# Patient Record
Sex: Female | Born: 1950 | Race: White | Hispanic: No | Smoking: Former smoker
Health system: Southern US, Community
[De-identification: ages and names within clinical notes are randomized; demographics above are authoritative.]

## PROBLEM LIST (undated history)

## (undated) DIAGNOSIS — S42201A Unspecified fracture of upper end of right humerus, initial encounter for closed fracture: Secondary | ICD-10-CM

## (undated) DIAGNOSIS — S42209A Unspecified fracture of upper end of unspecified humerus, initial encounter for closed fracture: Secondary | ICD-10-CM

## (undated) DIAGNOSIS — Z789 Other specified health status: Secondary | ICD-10-CM

## (undated) HISTORY — PX: TENDON REPAIR: SHX5111

## (undated) HISTORY — PX: TONSILLECTOMY: SUR1361

## (undated) HISTORY — DX: Unspecified fracture of upper end of unspecified humerus, initial encounter for closed fracture: S42.209A

---

## 2000-04-12 ENCOUNTER — Encounter: Admission: RE | Admit: 2000-04-12 | Discharge: 2000-04-12 | Payer: Self-pay | Admitting: *Deleted

## 2000-04-12 ENCOUNTER — Encounter: Payer: Self-pay | Admitting: *Deleted

## 2001-04-22 ENCOUNTER — Encounter: Payer: Self-pay | Admitting: *Deleted

## 2001-04-22 ENCOUNTER — Encounter: Admission: RE | Admit: 2001-04-22 | Discharge: 2001-04-22 | Payer: Self-pay | Admitting: *Deleted

## 2005-04-15 ENCOUNTER — Ambulatory Visit (HOSPITAL_COMMUNITY): Admission: RE | Admit: 2005-04-15 | Discharge: 2005-04-15 | Payer: Self-pay | Admitting: Obstetrics and Gynecology

## 2009-06-25 ENCOUNTER — Ambulatory Visit (HOSPITAL_COMMUNITY): Admission: RE | Admit: 2009-06-25 | Discharge: 2009-06-25 | Payer: Self-pay | Admitting: Obstetrics and Gynecology

## 2009-06-28 ENCOUNTER — Encounter: Admission: RE | Admit: 2009-06-28 | Discharge: 2009-06-28 | Payer: Self-pay | Admitting: Obstetrics and Gynecology

## 2009-07-03 ENCOUNTER — Encounter (INDEPENDENT_AMBULATORY_CARE_PROVIDER_SITE_OTHER): Payer: Self-pay | Admitting: Obstetrics and Gynecology

## 2009-07-03 ENCOUNTER — Encounter: Admission: RE | Admit: 2009-07-03 | Discharge: 2009-07-03 | Payer: Self-pay | Admitting: Obstetrics and Gynecology

## 2013-06-29 ENCOUNTER — Encounter (HOSPITAL_BASED_OUTPATIENT_CLINIC_OR_DEPARTMENT_OTHER): Payer: Self-pay | Admitting: *Deleted

## 2013-06-29 ENCOUNTER — Ambulatory Visit
Admission: RE | Admit: 2013-06-29 | Discharge: 2013-06-29 | Disposition: A | Discharge status: Home / Self Care | Payer: BC Managed Care – PPO | Source: Ambulatory Visit | Attending: Orthopedic Surgery | Admitting: Orthopedic Surgery

## 2013-06-29 ENCOUNTER — Ambulatory Visit: Payer: BC Managed Care – PPO

## 2013-06-29 ENCOUNTER — Other Ambulatory Visit: Payer: Self-pay | Admitting: Orthopedic Surgery

## 2013-06-29 DIAGNOSIS — M25511 Pain in right shoulder: Secondary | ICD-10-CM
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
## 2013-06-30 ENCOUNTER — Encounter (HOSPITAL_BASED_OUTPATIENT_CLINIC_OR_DEPARTMENT_OTHER): Payer: Self-pay

## 2013-06-30 ENCOUNTER — Ambulatory Visit (HOSPITAL_COMMUNITY): Payer: BC Managed Care – PPO

## 2013-06-30 ENCOUNTER — Ambulatory Visit (HOSPITAL_BASED_OUTPATIENT_CLINIC_OR_DEPARTMENT_OTHER)
Admission: RE | Admit: 2013-06-30 | Discharge: 2013-07-01 | Disposition: A | Discharge status: Home / Self Care | Payer: BC Managed Care – PPO | Source: Ambulatory Visit | Attending: Orthopedic Surgery | Admitting: Orthopedic Surgery

## 2013-06-30 ENCOUNTER — Encounter (HOSPITAL_BASED_OUTPATIENT_CLINIC_OR_DEPARTMENT_OTHER): Payer: BC Managed Care – PPO | Admitting: Anesthesiology

## 2013-06-30 ENCOUNTER — Encounter (HOSPITAL_BASED_OUTPATIENT_CLINIC_OR_DEPARTMENT_OTHER)
Admission: RE | Disposition: A | Discharge status: Home / Self Care | Payer: Self-pay | Source: Ambulatory Visit | Attending: Orthopedic Surgery

## 2013-06-30 ENCOUNTER — Ambulatory Visit (HOSPITAL_BASED_OUTPATIENT_CLINIC_OR_DEPARTMENT_OTHER): Payer: BC Managed Care – PPO | Admitting: Anesthesiology

## 2013-06-30 DIAGNOSIS — Z87891 Personal history of nicotine dependence: Secondary | ICD-10-CM | POA: Insufficient documentation

## 2013-06-30 DIAGNOSIS — S42201A Unspecified fracture of upper end of right humerus, initial encounter for closed fracture: Secondary | ICD-10-CM

## 2013-06-30 DIAGNOSIS — Z01812 Encounter for preprocedural laboratory examination: Secondary | ICD-10-CM | POA: Insufficient documentation

## 2013-06-30 DIAGNOSIS — S42209A Unspecified fracture of upper end of unspecified humerus, initial encounter for closed fracture: Secondary | ICD-10-CM | POA: Insufficient documentation

## 2013-06-30 HISTORY — PX: ORIF HUMERUS FRACTURE: SHX2126

## 2013-06-30 HISTORY — DX: Unspecified fracture of upper end of right humerus, initial encounter for closed fracture: S42.201A

## 2013-06-30 HISTORY — DX: Other specified health status: Z78.9

## 2013-06-30 SURGERY — OPEN REDUCTION INTERNAL FIXATION (ORIF) PROXIMAL HUMERUS FRACTURE
Anesthesia: General | Site: Arm Upper | Laterality: Right | Wound class: Clean

## 2013-06-30 MED ORDER — DEXAMETHASONE SODIUM PHOSPHATE 4 MG/ML IJ SOLN
INTRAMUSCULAR | Status: DC | PRN
  Administered 2013-06-30: 10 mg via INTRAVENOUS

## 2013-06-30 MED ORDER — ONDANSETRON HCL 4 MG/2ML IJ SOLN: 4.0000 mg | Freq: Four times a day (QID) | INTRAMUSCULAR | Status: DC | PRN

## 2013-06-30 MED ORDER — OXYCODONE-ACETAMINOPHEN 5-325 MG PO TABS
1.0000 | ORAL_TABLET | ORAL | Status: DC | PRN
  Administered 2013-07-01 (×2): 2 via ORAL

## 2013-06-30 MED ORDER — CEFAZOLIN SODIUM 1-5 GM-% IV SOLN
1.0000 g | Freq: Four times a day (QID) | INTRAVENOUS | Status: AC
  Administered 2013-06-30 – 2013-07-01 (×3): 1 g via INTRAVENOUS

## 2013-06-30 MED ORDER — PROMETHAZINE HCL 25 MG PO TABS: 25.0000 mg | ORAL_TABLET | Freq: Four times a day (QID) | ORAL | Status: DC | PRN

## 2013-06-30 MED ORDER — METHOCARBAMOL 500 MG PO TABS
500.0000 mg | ORAL_TABLET | Freq: Four times a day (QID) | ORAL | Status: DC | PRN
  Administered 2013-07-01 (×2): 500 mg via ORAL

## 2013-06-30 MED ORDER — FENTANYL CITRATE 0.05 MG/ML IJ SOLN
INTRAMUSCULAR | Status: AC
  Filled 2013-06-30: qty 2

## 2013-06-30 MED ORDER — PHENYLEPHRINE HCL 10 MG/ML IJ SOLN
INTRAMUSCULAR | Status: DC | PRN
  Administered 2013-06-30: 40 ug via INTRAVENOUS

## 2013-06-30 MED ORDER — SENNA 8.6 MG PO TABS: 1.0000 | ORAL_TABLET | Freq: Two times a day (BID) | ORAL | Status: DC

## 2013-06-30 MED ORDER — OXYCODONE HCL 5 MG/5ML PO SOLN: 5.0000 mg | Freq: Once | ORAL | Status: AC | PRN

## 2013-06-30 MED ORDER — SUCCINYLCHOLINE CHLORIDE 20 MG/ML IJ SOLN
INTRAMUSCULAR | Status: DC | PRN
  Administered 2013-06-30: 100 mg via INTRAVENOUS

## 2013-06-30 MED ORDER — METHOCARBAMOL 500 MG PO TABS: 500.0000 mg | ORAL_TABLET | Freq: Four times a day (QID) | ORAL | Status: DC

## 2013-06-30 MED ORDER — HYDROMORPHONE HCL PF 1 MG/ML IJ SOLN: 0.2500 mg | INTRAMUSCULAR | Status: DC | PRN

## 2013-06-30 MED ORDER — ONDANSETRON HCL 4 MG/2ML IJ SOLN
INTRAMUSCULAR | Status: DC | PRN
  Administered 2013-06-30: 4 mg via INTRAVENOUS

## 2013-06-30 MED ORDER — BUPIVACAINE-EPINEPHRINE PF 0.5-1:200000 % IJ SOLN
INTRAMUSCULAR | Status: DC | PRN
  Administered 2013-06-30: 150 mg

## 2013-06-30 MED ORDER — SODIUM CHLORIDE 0.9 % IV SOLN
INTRAVENOUS | Status: DC
  Administered 2013-06-30: 75 mL/h via INTRAVENOUS

## 2013-06-30 MED ORDER — FENTANYL CITRATE 0.05 MG/ML IJ SOLN
INTRAMUSCULAR | Status: DC | PRN
  Administered 2013-06-30: 50 ug via INTRAVENOUS
  Administered 2013-06-30: 25 ug via INTRAVENOUS

## 2013-06-30 MED ORDER — OXYCODONE HCL 5 MG PO TABS: 5.0000 mg | ORAL_TABLET | ORAL | Status: DC | PRN

## 2013-06-30 MED ORDER — PROPOFOL 10 MG/ML IV BOLUS
INTRAVENOUS | Status: DC | PRN
  Administered 2013-06-30: 200 mg via INTRAVENOUS

## 2013-06-30 MED ORDER — OXYCODONE HCL 5 MG PO TABS: 5.0000 mg | ORAL_TABLET | Freq: Once | ORAL | Status: AC | PRN

## 2013-06-30 MED ORDER — FENTANYL CITRATE 0.05 MG/ML IJ SOLN
50.0000 ug | INTRAMUSCULAR | Status: DC | PRN
  Administered 2013-06-30: 100 ug via INTRAVENOUS

## 2013-06-30 MED ORDER — OXYCODONE-ACETAMINOPHEN 10-325 MG PO TABS: 1.0000 | ORAL_TABLET | Freq: Four times a day (QID) | ORAL | Status: DC | PRN

## 2013-06-30 MED ORDER — METOCLOPRAMIDE HCL 5 MG PO TABS: 5.0000 mg | ORAL_TABLET | Freq: Three times a day (TID) | ORAL | Status: DC | PRN

## 2013-06-30 MED ORDER — CEFAZOLIN SODIUM-DEXTROSE 2-3 GM-% IV SOLR
INTRAVENOUS | Status: AC
  Filled 2013-06-30: qty 50

## 2013-06-30 MED ORDER — MIDAZOLAM HCL 2 MG/2ML IJ SOLN
1.0000 mg | INTRAMUSCULAR | Status: DC | PRN
  Administered 2013-06-30: 2 mg via INTRAVENOUS

## 2013-06-30 MED ORDER — METOCLOPRAMIDE HCL 5 MG/ML IJ SOLN: 5.0000 mg | Freq: Three times a day (TID) | INTRAMUSCULAR | Status: DC | PRN

## 2013-06-30 MED ORDER — ONDANSETRON HCL 4 MG PO TABS: 4.0000 mg | ORAL_TABLET | Freq: Four times a day (QID) | ORAL | Status: DC | PRN

## 2013-06-30 MED ORDER — ZOLPIDEM TARTRATE 5 MG PO TABS: 5.0000 mg | ORAL_TABLET | Freq: Every evening | ORAL | Status: DC | PRN

## 2013-06-30 MED ORDER — MIDAZOLAM HCL 2 MG/2ML IJ SOLN
INTRAMUSCULAR | Status: AC
  Filled 2013-06-30: qty 2

## 2013-06-30 MED ORDER — PROMETHAZINE HCL 25 MG/ML IJ SOLN: 6.2500 mg | INTRAMUSCULAR | Status: DC | PRN

## 2013-06-30 MED ORDER — CLONAZEPAM 0.5 MG PO TABS: 0.5000 mg | ORAL_TABLET | Freq: Three times a day (TID) | ORAL | Status: DC | PRN

## 2013-06-30 MED ORDER — METHOCARBAMOL 100 MG/ML IJ SOLN: 500.0000 mg | Freq: Four times a day (QID) | INTRAVENOUS | Status: DC | PRN

## 2013-06-30 MED ORDER — FLUOXETINE HCL 20 MG PO CAPS: 20.0000 mg | ORAL_CAPSULE | Freq: Every day | ORAL | Status: DC

## 2013-06-30 MED ORDER — DEXAMETHASONE SODIUM PHOSPHATE 4 MG/ML IJ SOLN
INTRAMUSCULAR | Status: DC | PRN
  Administered 2013-06-30: 4 mg

## 2013-06-30 MED ORDER — HYDROMORPHONE HCL PF 1 MG/ML IJ SOLN: 0.5000 mg | INTRAMUSCULAR | Status: DC | PRN

## 2013-06-30 MED ORDER — CEFAZOLIN SODIUM 1-5 GM-% IV SOLN
INTRAVENOUS | Status: AC
  Filled 2013-06-30: qty 50

## 2013-06-30 MED ORDER — CEFAZOLIN SODIUM-DEXTROSE 2-3 GM-% IV SOLR
INTRAVENOUS | Status: DC | PRN
  Administered 2013-06-30: 2 g via INTRAVENOUS

## 2013-06-30 MED ORDER — SENNA-DOCUSATE SODIUM 8.6-50 MG PO TABS
2.0000 | ORAL_TABLET | Freq: Every day | ORAL | Status: DC
Start: 2013-06-30 — End: 2013-09-14

## 2013-06-30 MED ORDER — AMOXICILLIN 500 MG PO CAPS
500.0000 mg | ORAL_CAPSULE | Freq: Three times a day (TID) | ORAL | Status: DC
  Administered 2013-06-30: 500 mg via ORAL

## 2013-06-30 MED ORDER — DOCUSATE SODIUM 100 MG PO CAPS: 100.0000 mg | ORAL_CAPSULE | Freq: Two times a day (BID) | ORAL | Status: DC

## 2013-06-30 MED ORDER — LACTATED RINGERS IV SOLN
INTRAVENOUS | Status: DC
  Administered 2013-06-30 (×2): via INTRAVENOUS

## 2013-06-30 SURGICAL SUPPLY — 73 items
APL SKNCLS STERI-STRIP NONHPOA (GAUZE/BANDAGES/DRESSINGS) ×1
BENZOIN TINCTURE PRP APPL 2/3 (GAUZE/BANDAGES/DRESSINGS) ×1 IMPLANT
BIT DRILL 2.8X4 QC CORT (BIT) ×1 IMPLANT
BIT DRILL 4 LONG FAST STEP (BIT) ×1 IMPLANT
BIT DRILL 4 SHORT FAST STEP (BIT) ×1 IMPLANT
BLADE SURG 10 STRL SS (BLADE) ×2 IMPLANT
BLADE SURG 15 STRL LF DISP TIS (BLADE) ×2 IMPLANT
BLADE SURG 15 STRL SS (BLADE) ×2
CANISTER SUCT 3000ML (MISCELLANEOUS) IMPLANT
CLEANER CAUTERY TIP 5X5 PAD (MISCELLANEOUS) ×1 IMPLANT
DECANTER SPIKE VIAL GLASS SM (MISCELLANEOUS) IMPLANT
DRAPE C-ARM 42X72 X-RAY (DRAPES) ×2 IMPLANT
DRAPE INCISE IOBAN 66X45 STRL (DRAPES) ×2 IMPLANT
DRAPE SURG 17X23 STRL (DRAPES) ×1 IMPLANT
DRAPE U 20/CS (DRAPES) ×1 IMPLANT
DRAPE U-SHAPE 47X51 STRL (DRAPES) ×2 IMPLANT
DRAPE U-SHAPE 76X120 STRL (DRAPES) ×4 IMPLANT
DURAPREP 26ML APPLICATOR (WOUND CARE) ×3 IMPLANT
ELECT REM PT RETURN 9FT ADLT (ELECTROSURGICAL) ×2
ELECTRODE REM PT RTRN 9FT ADLT (ELECTROSURGICAL) ×1 IMPLANT
GAUZE SPONGE 4X4 16PLY XRAY LF (GAUZE/BANDAGES/DRESSINGS) IMPLANT
GAUZE XEROFORM 1X8 LF (GAUZE/BANDAGES/DRESSINGS) IMPLANT
GLOVE BIOGEL PI IND STRL 7.0 (GLOVE) IMPLANT
GLOVE BIOGEL PI IND STRL 8 (GLOVE) ×2 IMPLANT
GLOVE BIOGEL PI INDICATOR 7.0 (GLOVE) ×1
GLOVE BIOGEL PI INDICATOR 8 (GLOVE) ×3
GLOVE ECLIPSE 6.5 STRL STRAW (GLOVE) ×1 IMPLANT
GLOVE ORTHO TXT STRL SZ7.5 (GLOVE) ×4 IMPLANT
GLOVE SURG ORTHO 8.0 STRL STRW (GLOVE) ×2 IMPLANT
GOWN BRE IMP PREV XXLGXLNG (GOWN DISPOSABLE) ×5 IMPLANT
KIT REMOVER STAPLE SKIN (MISCELLANEOUS) ×1 IMPLANT
NS IRRIG 1000ML POUR BTL (IV SOLUTION) ×2 IMPLANT
PACK ARTHROSCOPY DSU (CUSTOM PROCEDURE TRAY) ×2 IMPLANT
PACK BASIN DAY SURGERY FS (CUSTOM PROCEDURE TRAY) ×2 IMPLANT
PAD CLEANER CAUTERY TIP 5X5 (MISCELLANEOUS) ×1
PEG STND 4.0X37.5MM (Orthopedic Implant) ×2 IMPLANT
PEG STND 4.0X40MM (Orthopedic Implant) ×4 IMPLANT
PEG STND 4.0X47.5MM (Orthopedic Implant) ×2 IMPLANT
PEGSTD 4.0X37.5MM (Orthopedic Implant) IMPLANT
PEGSTD 4.0X40MM (Orthopedic Implant) IMPLANT
PEGSTD 4.0X47.5MM (Orthopedic Implant) IMPLANT
PENCIL BUTTON HOLSTER BLD 10FT (ELECTRODE) ×2 IMPLANT
PIN GUIDE SHOULDER 2.0MM (PIN) ×1 IMPLANT
PLATE SHOULDER S3 3HOLE RT (Plate) ×1 IMPLANT
SCREW LOCK 90D ANGLED 3.8X26 (Screw) ×1 IMPLANT
SCREW MULTIDIR 3.8X26 HUMRL (Screw) ×1 IMPLANT
SLEEVE SCD COMPRESS KNEE MED (MISCELLANEOUS) ×3 IMPLANT
SLING ARM FOAM STRAP LRG (SOFTGOODS) IMPLANT
SLING ARM FOAM STRAP MED (SOFTGOODS) IMPLANT
SLING ARM FOAM STRAP XLG (SOFTGOODS) IMPLANT
SLING ARM IMMOBILIZER LRG (SOFTGOODS) ×1 IMPLANT
SLING ARM IMMOBILIZER MED (SOFTGOODS) IMPLANT
SPONGE GAUZE 4X4 12PLY (GAUZE/BANDAGES/DRESSINGS) ×2 IMPLANT
SPONGE LAP 18X18 X RAY DECT (DISPOSABLE) ×3 IMPLANT
SPONGE LAP 4X18 X RAY DECT (DISPOSABLE) ×1 IMPLANT
STRIP CLOSURE SKIN 1/2X4 (GAUZE/BANDAGES/DRESSINGS) ×1 IMPLANT
SUCTION FRAZIER TIP 10 FR DISP (SUCTIONS) ×1 IMPLANT
SUPPORT WRAP ARM LG (MISCELLANEOUS) ×1 IMPLANT
SUT FIBERWIRE #2 38 T-5 BLUE (SUTURE) ×2
SUT MNCRL AB 4-0 PS2 18 (SUTURE) ×1 IMPLANT
SUT VIC AB 0 CT1 18XCR BRD 8 (SUTURE) ×1 IMPLANT
SUT VIC AB 0 CT1 27 (SUTURE) ×2
SUT VIC AB 0 CT1 27XBRD ANBCTR (SUTURE) IMPLANT
SUT VIC AB 0 CT1 8-18 (SUTURE) ×2
SUT VIC AB 2-0 SH 27 (SUTURE) ×2
SUT VIC AB 2-0 SH 27XBRD (SUTURE) IMPLANT
SUT VICRYL 3-0 CR8 SH (SUTURE) ×2 IMPLANT
SUTURE FIBERWR #2 38 T-5 BLUE (SUTURE) IMPLANT
SYR BULB 3OZ (MISCELLANEOUS) ×2 IMPLANT
SYR BULB IRRIGATION 50ML (SYRINGE) ×2 IMPLANT
TAPE STRIPS DRAPE STRL (GAUZE/BANDAGES/DRESSINGS) IMPLANT
TOWEL OR 17X24 6PK STRL BLUE (TOWEL DISPOSABLE) ×2 IMPLANT
YANKAUER SUCT BULB TIP NO VENT (SUCTIONS) IMPLANT

## 2013-06-30 NOTE — Anesthesia Postprocedure Evaluation (Signed)
Anesthesia Post Note  Patient: Holly Ramsey  Procedure(s) Performed: Procedure(s) (LRB): OPEN REDUCTION INTERNAL FIXATION (ORIF) RIGHT PROXIMAL HUMERUS FRACTURE (Right)  Anesthesia type: general  Patient location: PACU  Post pain: Pain level controlled  Post assessment: Patient's Cardiovascular Status Stable  Last Vitals:  Filed Vitals:   06/30/13 1630  BP: 140/81  Pulse: 115  Temp:   Resp: 15    Post vital signs: Reviewed and stable  Level of consciousness: sedated  Complications: No apparent anesthesia complications
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
## 2013-06-30 NOTE — Transfer of Care (Signed)
Immediate Anesthesia Transfer of Care Note  Patient: Holly Ramsey  Procedure(s) Performed: Procedure(s): OPEN REDUCTION INTERNAL FIXATION (ORIF) RIGHT PROXIMAL HUMERUS FRACTURE (Right)  Patient Location: PACU  Anesthesia Type:General and Regional  Level of Consciousness: awake, alert  and oriented  Airway & Oxygen Therapy: Patient Spontanous Breathing and Patient connected to face mask oxygen  Post-op Assessment: Report given to PACU RN and Post -op Vital signs reviewed and stable  Post vital signs: Reviewed and stable  Complications: No apparent anesthesia complications

## 2013-06-30 NOTE — Anesthesia Procedure Notes (Addendum)
Anesthesia Regional Block:  Interscalene brachial plexus block  Pre-Anesthetic Checklist: ,, timeout performed, Correct Patient, Correct Site, Correct Laterality, Correct Procedure, Correct Position, site marked, Risks and benefits discussed,  Surgical consent,  Pre-op evaluation,  At surgeon's request and post-op pain management  Laterality: Right  Prep: chloraprep       Needles:  Injection technique: Single-shot  Needle Type: Echogenic Stimulator Needle     Needle Length: 5cm 5 cm Needle Gauge: 22 and 22 G    Additional Needles:  Procedures: ultrasound guided (picture in chart) and nerve stimulator Interscalene brachial plexus block  Nerve Stimulator or Paresthesia:  Response: bicep contraction, 0.45 mA,   Additional Responses:   Narrative:  Start time: 06/30/2013 11:11 AM End time: 06/30/2013 11:21 AM Injection made incrementally with aspirations every 5 mL.  Performed by: Personally  Anesthesiologist: J. Adonis Huguenin, MD  Additional Notes: Functioning IV was confirmed and monitors applied.  A 50mm 22ga echogenic arrow stimulator was used. Sterile prep and drape,hand hygiene and sterile gloves were used.Ultrasound guidance: relevant anatomy identified, needle position confirmed, local anesthetic spread visualized around nerve(s)., vascular puncture avoided.  Image printed for medical record.  Negative aspiration and negative test dose prior to incremental administration of local anesthetic. The patient tolerated the procedure well.  Interscalene brachial plexus block

## 2013-06-30 NOTE — Progress Notes (Signed)
Assisted Dr. Singer with right, ultrasound guided, supraclavicular block. Side rails up, monitors on throughout procedure. See vital signs in flow sheet. Tolerated Procedure well. 

## 2013-06-30 NOTE — Anesthesia Preprocedure Evaluation (Addendum)
Anesthesia Evaluation  Patient identified by MRN, date of birth, ID band Patient awake    Reviewed: Allergy & Precautions, H&P , NPO status , Patient's Chart, lab work & pertinent test results  Airway Mallampati: II TM Distance: >3 FB Neck ROM: Full    Dental  (+) Teeth Intact and Dental Advisory Given   Pulmonary neg pulmonary ROS,  breath sounds clear to auscultation  Pulmonary exam normal       Cardiovascular negative cardio ROS  Rhythm:regular Rate:Normal     Neuro/Psych negative neurological ROS  negative psych ROS   GI/Hepatic negative GI ROS, Neg liver ROS,   Endo/Other  negative endocrine ROS  Renal/GU negative Renal ROS     Musculoskeletal   Abdominal   Peds  Hematology   Anesthesia Other Findings   Reproductive/Obstetrics negative OB ROS                          Anesthesia Physical Anesthesia Plan  ASA: II  Anesthesia Plan: General   Post-op Pain Management: MAC Combined w/ Regional for Post-op pain   Induction:   Airway Management Planned: Oral ETT  Additional Equipment:   Intra-op Plan:   Post-operative Plan: Extubation in OR  Informed Consent: I have reviewed the patients History and Physical, chart, labs and discussed the procedure including the risks, benefits and alternatives for the proposed anesthesia with the patient or authorized representative who has indicated his/her understanding and acceptance.   Dental advisory given  Plan Discussed with: CRNA, Anesthesiologist and Surgeon  Anesthesia Plan Comments:        Anesthesia Quick Evaluation

## 2013-06-30 NOTE — Op Note (Signed)
06/30/2013  3:03 PM  PATIENT:  Holly Ramsey    PRE-OPERATIVE DIAGNOSIS:  Right Proximal Humerus Fracture  POST-OPERATIVE DIAGNOSIS:  Same  PROCEDURE:  OPEN REDUCTION INTERNAL FIXATION (ORIF) RIGHT PROXIMAL HUMERUS FRACTURE  SURGEON:  Eulas Post, MD  PHYSICIAN ASSISTANT: Janace Litten, OPA-C, present and scrubbed throughout the case, critical for completion in a timely fashion, and for retraction, instrumentation, and closure.  ANESTHESIA:   General  PREOPERATIVE INDICATIONS:  Holly Ramsey is a  62 y.o. female with a diagnosis of Right Proximal Humerus Fracture who elected for surgical management.    The risks benefits and alternatives were discussed with the patient including but not limited to the risks of nonoperative treatment, versus surgical intervention including infection, bleeding, nerve injury, malunion, nonunion, the need for revision surgery, hardware prominence, hardware failure, the need for hardware removal, blood clots, cardiopulmonary complications, conversion to arthroplasty, morbidity, mortality, among others, and they were willing to proceed.  Predicted outcome is good, although there will be at least a six to nine month expected recovery.    OPERATIVE IMPLANTS: Biomet S3 locking plate with a total of 4 smooth pegs in the head, 2 screws in the shaft, and FiberWire sutures through the supraspinatus and infraspinatus. These were tied through the plate.  OPERATIVE FINDINGS: Displaced proximal humerus fracture with substantial comminution at the anterior lesser tuberosity, including the bicipital groove. Her bony anatomy was also very small, and in fact appropriate placement of the plate had the inferior most 2 locking screws immediately abutting the cortex inferiorly, which did not allow placement of the screws, because they were skirting through the posterior lateral cortex, and would not pass the shaft into the head. For this reason I was only able to get 4 pegs  into the head, but these were excellent pegs.  OPERATIVE PROCEDURE: The patient was brought to the operating room and placed in the supine position. General anesthesia was administered. IV antibiotics were given. She was placed in the beach chair position. All bony prominences were padded. The upper extremity was prepped and draped in usual sterile fashion. Deltopectoral incision was performed.  I exposed the fracture site, and placed deep retractors. I did not tenotomize the biceps tendon. This was left in place, although the repair of the lesser tuberosity using FiberWire from the subscapularis across to the plate likely had an effective biceps tenodesis because the FiberWire draped over the anterior aspect of the biceps tendon. I placed supraspinatus and subscapularis stitches, and then reduced the head onto the shaft. This was maintained in satisfactory position.  I applied the plate and secured it into the sliding hole first. I first plate placement was actually entire screw length to high, and so I removed the screw and reposition the plate distally. I confirmed position of the reduction and the plate with C-arm, and I placed a total of 2 guidewires into the appropriate position in the head. I was satisfied that the plate was distal appropriately, and then secured the plate proximally with smooth pegs, taking care to prevent penetration into the arch articular surface, using C-arm, as well as manual feel using a hand drill. As indicated above, the flare of the metaphysis from anterior to posterior was too narrow in her, and blocked the placement of the inferior most screws. I first tried with a hand drill, and then actually even drilled with the power drill, but the smooth tip of the drill did not allow for passage, and I did not feel that  I needed to force the issue, given that thyroid he had excellent fixation in the head with the other 4 pegs.   I then secured the plate distally using another  cortical screw. I brought the FiberWire sutures through the holes in the plate, and then secured the greater and lesser tuberosity back to the plate with FiberWire augmentation. Once complete fixation and reduction of been achieved, took final C-arm pictures, and irrigated the wounds copiously, and repaired the deltopectoral interval with Vicryl followed by Vicryl for the subcutaneous tissue with Monocryl and Steri-Strips for the skin. She was placed in a sling. She had a preoperative regional block as well. She tolerated the procedure well with no complications.

## 2013-07-01 MED ORDER — METHOCARBAMOL 500 MG PO TABS
ORAL_TABLET | ORAL | Status: AC
  Filled 2013-07-01: qty 1

## 2013-07-01 MED ORDER — CEFAZOLIN SODIUM 1-5 GM-% IV SOLN
INTRAVENOUS | Status: AC
  Filled 2013-07-01: qty 50

## 2013-07-01 MED ORDER — OXYCODONE-ACETAMINOPHEN 5-325 MG PO TABS
ORAL_TABLET | ORAL | Status: AC
  Filled 2013-07-01: qty 2

## 2013-07-03 ENCOUNTER — Other Ambulatory Visit: Payer: Self-pay

## 2013-07-03 ENCOUNTER — Encounter (HOSPITAL_BASED_OUTPATIENT_CLINIC_OR_DEPARTMENT_OTHER): Payer: Self-pay | Admitting: Orthopedic Surgery

## 2013-09-14 ENCOUNTER — Encounter: Payer: Self-pay | Admitting: Family Medicine

## 2013-09-14 ENCOUNTER — Ambulatory Visit (INDEPENDENT_AMBULATORY_CARE_PROVIDER_SITE_OTHER): Payer: BC Managed Care – PPO | Admitting: Family Medicine

## 2013-09-14 VITALS — BP 134/90 | HR 104 | Ht 63.0 in | Wt 146.0 lb

## 2013-09-14 DIAGNOSIS — S42209A Unspecified fracture of upper end of unspecified humerus, initial encounter for closed fracture: Secondary | ICD-10-CM

## 2013-09-14 DIAGNOSIS — S42201A Unspecified fracture of upper end of right humerus, initial encounter for closed fracture: Secondary | ICD-10-CM

## 2013-09-14 DIAGNOSIS — S060X9A Concussion with loss of consciousness of unspecified duration, initial encounter: Secondary | ICD-10-CM

## 2013-09-14 DIAGNOSIS — R42 Dizziness and giddiness: Secondary | ICD-10-CM

## 2013-09-14 DIAGNOSIS — M542 Cervicalgia: Secondary | ICD-10-CM
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
## 2013-09-28 ENCOUNTER — Encounter: Payer: Self-pay | Admitting: Family Medicine

## 2013-09-28 ENCOUNTER — Ambulatory Visit (INDEPENDENT_AMBULATORY_CARE_PROVIDER_SITE_OTHER): Payer: BC Managed Care – PPO | Admitting: Family Medicine

## 2013-09-28 VITALS — BP 112/74 | HR 80 | Wt 145.0 lb

## 2013-09-28 DIAGNOSIS — G479 Sleep disorder, unspecified: Secondary | ICD-10-CM

## 2013-09-28 DIAGNOSIS — R42 Dizziness and giddiness: Secondary | ICD-10-CM

## 2013-09-28 DIAGNOSIS — S060X9A Concussion with loss of consciousness of unspecified duration, initial encounter: Secondary | ICD-10-CM

## 2013-09-28 MED ORDER — MECLIZINE HCL 12.5 MG PO TABS: 12.5000 mg | ORAL_TABLET | Freq: Three times a day (TID) | ORAL | Status: DC | PRN

## 2013-09-28 MED ORDER — TRAZODONE HCL 100 MG PO TABS: 100.0000 mg | ORAL_TABLET | Freq: Every day | ORAL | Status: DC
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
## 2013-10-05 DIAGNOSIS — S42209A Unspecified fracture of upper end of unspecified humerus, initial encounter for closed fracture: Secondary | ICD-10-CM

## 2013-10-05 HISTORY — DX: Unspecified fracture of upper end of unspecified humerus, initial encounter for closed fracture: S42.209A

## 2013-10-11 ENCOUNTER — Encounter: Payer: Self-pay | Admitting: Internal Medicine

## 2013-10-30 ENCOUNTER — Encounter: Payer: Self-pay | Admitting: Family Medicine

## 2013-10-30 ENCOUNTER — Ambulatory Visit (INDEPENDENT_AMBULATORY_CARE_PROVIDER_SITE_OTHER): Payer: BC Managed Care – PPO | Admitting: Family Medicine

## 2013-10-30 VITALS — BP 108/80 | HR 60 | Wt 145.0 lb

## 2013-10-30 DIAGNOSIS — R42 Dizziness and giddiness: Secondary | ICD-10-CM

## 2013-10-30 DIAGNOSIS — S060X9A Concussion with loss of consciousness of unspecified duration, initial encounter: Secondary | ICD-10-CM

## 2013-10-30 NOTE — Progress Notes (Signed)
   Subjective:    Patient ID: Holly Ramsey, female    DOB: 03/28/1951, 63 y.o.   MRN: [id number]  HPI She is here for recheck. She states that she is roughly 93% better. She still is having some slight dizziness with head motion. 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 Presently she is on Neurontin 300 mg a day. She is having no other symptoms. She continues in counseling to help deal psychologically with what going on.   Review of Systems     Objective:   Physical Exam Alert and in no distress otherwise not examined       Assessment & Plan:  Concussion with loss of consciousness  Dizziness  at this point I think she needs to continue with pain management and was counseling. I recommended followup with me on an as-needed basis.

## 2014-01-30 ENCOUNTER — Telehealth: Payer: Self-pay | Admitting: Family Medicine

## 2014-01-30 NOTE — Telephone Encounter (Signed)
Request of records from [facility]

## 2016-04-13 ENCOUNTER — Other Ambulatory Visit: Payer: Self-pay | Admitting: Family Medicine

## 2016-04-13 DIAGNOSIS — Z1231 Encounter for screening mammogram for malignant neoplasm of breast: Secondary | ICD-10-CM

## 2016-04-23 ENCOUNTER — Ambulatory Visit
Admission: RE | Admit: 2016-04-23 | Discharge: 2016-04-23 | Disposition: A | Discharge status: Home / Self Care | Payer: BC Managed Care – PPO | Source: Ambulatory Visit | Attending: Family Medicine | Admitting: Family Medicine

## 2016-04-23 DIAGNOSIS — Z1231 Encounter for screening mammogram for malignant neoplasm of breast: Secondary | ICD-10-CM

## 2019-06-01 ENCOUNTER — Encounter: Payer: Self-pay | Admitting: Podiatry

## 2019-06-01 ENCOUNTER — Ambulatory Visit (INDEPENDENT_AMBULATORY_CARE_PROVIDER_SITE_OTHER): Payer: Medicare Other

## 2019-06-01 ENCOUNTER — Ambulatory Visit: Payer: Medicare Other | Admitting: Podiatry

## 2019-06-01 ENCOUNTER — Other Ambulatory Visit: Payer: Self-pay

## 2019-06-01 DIAGNOSIS — M779 Enthesopathy, unspecified: Secondary | ICD-10-CM

## 2019-06-01 DIAGNOSIS — M21612 Bunion of left foot: Secondary | ICD-10-CM | POA: Diagnosis not present

## 2019-06-01 DIAGNOSIS — M21611 Bunion of right foot: Secondary | ICD-10-CM

## 2019-06-01 DIAGNOSIS — M2042 Other hammer toe(s) (acquired), left foot: Secondary | ICD-10-CM

## 2019-06-01 NOTE — Patient Instructions (Signed)
Pre-Operative Instructions  Congratulations, you have decided to take an important step towards improving your quality of life.  You can be assured that the doctors and staff at [facility] will be with you every step of the way.  Here are some important things you should know:  1. Plan to be at the surgery center/hospital at least 1 (one) hour prior to your scheduled time, unless otherwise directed by the surgical center/hospital staff.  You must have a responsible adult accompany you, remain during the surgery and drive you home.  Make sure you have directions to the surgical center/hospital to ensure you arrive on time. 2. If you are having surgery at [facility] or [facility] hospitals, you will need a copy of your medical history and physical form from your family physician within one month prior to the date of surgery. We will give you a form for your primary physician to complete.  3. We make every effort to accommodate the date you request for surgery.  However, there are times where surgery dates or times have to be moved.  We will contact you as soon as possible if a change in schedule is required.   4. No aspirin/ibuprofen for one week before surgery.  If you are on aspirin, any non-steroidal anti-inflammatory medications (Mobic, Aleve, Ibuprofen) should not be taken seven (7) days prior to your surgery.  You make take Tylenol for pain prior to surgery.  5. Medications - If you are taking daily heart and blood pressure medications, seizure, reflux, allergy, asthma, anxiety, pain or diabetes medications, make sure you notify the surgery center/hospital before the day of surgery so they can tell you which medications you should take or avoid the day of surgery. 6. No food or drink after midnight the night before surgery unless directed otherwise by surgical center/hospital staff. 7. No alcoholic beverages 24-hours prior to surgery.  No smoking 24-hours prior or 24-hours after  surgery. 8. Wear loose pants or shorts. They should be loose enough to fit over bandages, boots, and casts. 9. Don't wear slip-on shoes. Sneakers are preferred. 10. Bring your boot with you to the surgery center/hospital.  Also bring crutches or a walker if your physician has prescribed it for you.  If you do not have this equipment, it will be provided for you after surgery. 11. If you have not been contacted by the surgery center/hospital by the day before your surgery, call to confirm the date and time of your surgery. 12. Leave-time from work may vary depending on the type of surgery you have.  Appropriate arrangements should be made prior to surgery with your employer. 13. Prescriptions will be provided immediately following surgery by your doctor.  Fill these as soon as possible after surgery and take the medication as directed. Pain medications will not be refilled on weekends and must be approved by the doctor. 14. Remove nail polish on the operative foot and avoid getting pedicures prior to surgery. 15. Wash the night before surgery.  The night before surgery wash the foot and leg well with water and the antibacterial soap provided. Be sure to pay special attention to beneath the toenails and in between the toes.  Wash for at least three (3) minutes. Rinse thoroughly with water and dry well with a towel.  Perform this wash unless told not to do so by your physician.  Enclosed: 1 Ice pack (please put in freezer the night before surgery)   1 Hibiclens skin cleaner     Pre-op instructions  If you have any questions regarding the instructions, please do not hesitate to call our office.  [place]: [address], [place], [place] [address] -- [phone]  [place]: [address] [place], [place] [address] -- [phone]  [place]: [address]  [place],  [address] -- [phone]   Website: [email or web address] 

## 2019-06-05 ENCOUNTER — Telehealth: Payer: Self-pay | Admitting: Podiatry

## 2019-06-05 NOTE — Progress Notes (Signed)
Subjective:   Patient ID: Holly Ramsey, female   DOB: 68 y.o.   MRN: [id number]   HPI 68 year old female presents the office today for concerns of a painful hammertoe on the left second toe.  She has bunions on both of her feet but they are not causing pain.  She had a " hereditary bunion" on the right foot majority of her life not causing any pain.  Her left foot is causing pain.  She is tried offloading pads, shoe modifications the aseptic improvement and she wants to consider surgical intervention at this time.   Review of Systems  All other systems reviewed and are negative.  Past Medical History:  Diagnosis Date  . Fracture of humerus, proximal, right, closed 06/30/2013  . Medical history non-contributory   . Proximal humerus fracture 10/05/13   Right fracture    Past Surgical History:  Procedure Laterality Date  . ORIF HUMERUS FRACTURE Right 06/30/2013   Procedure: OPEN REDUCTION INTERNAL FIXATION (ORIF) RIGHT PROXIMAL HUMERUS FRACTURE;  Surgeon: Johnny Bridge, MD;  Location: [facility];  Service: Orthopedics;  Laterality: Right;  . TENDON REPAIR     left foot  . TONSILLECTOMY       Current Outpatient Medications:  .  buPROPion (WELLBUTRIN XL) 300 MG 24 hr tablet, Take 300 mg by mouth daily., Disp: , Rfl:  .  gabapentin (NEURONTIN) 300 MG capsule, Take 300 mg by mouth daily., Disp: , Rfl:  .  meclizine (ANTIVERT) 12.5 MG tablet, Take 1 tablet (12.5 mg total) by mouth 3 (three) times daily as needed for dizziness., Disp: 30 tablet, Rfl: 1  No Known Allergies        Objective:  Physical Exam  General: AAO x3, NAD  Dermatological: Skin is warm, dry and supple bilateral. Nails x 10 are well manicured; remaining integument appears unremarkable at this time. There are no open sores, no preulcerative lesions, no rash or signs of infection present.  Vascular: Dorsalis Pedis artery and Posterior Tibial artery pedal pulses are 2/4 bilateral with immedate  capillary fill time. Pedal hair growth present. No varicosities and no lower extremity edema present bilateral. There is no pain with calf compression, swelling, warmth, erythema.   Neruologic: Grossly intact via light touch bilateral. Vibratory intact via tuning fork bilateral. Protective threshold with Semmes Wienstein monofilament intact to all pedal sites bilateral. Patellar and Achilles deep tendon reflexes 2+ bilateral. No Babinski or clonus noted bilateral.   Musculoskeletal: Flatfoot is present.  Significant bunion deformity on the right side worse than left however bunion still present left side and there is no overlapping second digit on the left side.  Majority of her discomfort to the second MPJ as well as the second digit.  No other areas of discomfort are elicited.  Muscular strength 5/5 in all groups tested bilateral.  Gait: Unassisted, Nonantalgic.       Assessment:   68 year old female with symptomatic left foot hammertoe deformity, capsulitis    Plan:  -Treatment options discussed including all alternatives, risks, and complications -Etiology of symptoms were discussed -X-rays were obtained and reviewed with the patient. Subluxation of the second MPJ with hammertoe deformity as well as bunion deformity. -We discussed with conservative as well as surgical treatment options.  This time she is attempted numerous conservative treatments with insert big improvement.  She wants to consider surgery. -Discussed with her left foot bunion correction in order to get the second toe in a rectus position.  We will plan  for left bunionectomy, second digit hammertoe repair with second metatarsal osteotomy and pinning of the second MPJ. -The incision placement as well as the postoperative course was discussed with the patient. I discussed risks of the surgery which include, but not limited to, infection, bleeding, pain, swelling, need for further surgery, delayed or nonhealing, painful or ugly  scar, numbness or sensation changes, over/under correction, recurrence, transfer lesions, further deformity, hardware failure, DVT/PE, loss of toe/foot. Patient understands these risks and wishes to proceed with surgery. The surgical consent was reviewed with the patient all 3 pages were signed. No promises or guarantees were given to the outcome of the procedure. All questions were answered to the best of my ability. Before the surgery the patient was encouraged to call the office if there is any further questions. The surgery will be performed at the [facility] on an outpatient basis.  -CAM boot dispensed for postoperative use  Vivi Barrack DPM

## 2019-06-05 NOTE — Telephone Encounter (Signed)
DOS: 06/28/2019 SURGICAL PROCEDURES: Holly Ramsey, Capsulotomy MPJ Release Joint 2nd Lt, Hammertoe Repair 2nd Lt CPT CODES: 68341, [phone], [phone] DX CODES: M77.9, M20.12, M20.42  [employer] Effective Date: 08/31/2018  Notification or Prior Authorization is not required for the requested services  This [employer]vant[employer] members plan does not currently require a prior authorization for these services. If you have general questions about the prior authorization requirements, please call us at [phone] or visit [email or web address] > Clinician Resources > Advance and Admission Notification Requirements. The number above acknowledges your notification. Please write this number down for future reference. Notification is not a guarantee of coverage or payment.  Decision ID #:[id number]  The number above acknowledges your inquiry and our response. Please write this number down and refer to it for future inquiries. Coverage and payment for an item or service is governed by the member's benefit plan document, and, if applicable, the provider's participation agreement with the Health Plan.

## 2019-06-21 ENCOUNTER — Telehealth: Payer: Self-pay | Admitting: *Deleted

## 2019-06-21 NOTE — Telephone Encounter (Signed)
"  I'm scheduled for surgery on October 28.  I still haven't heard anything from anyone.  I don't know how much I will have to pay.  Can I make payment arrangements or what?"  You will not get a call from someone at the surgery center until a day or two before your scheduled surgery date.  I'll ask Jocelyn Lamer in our insurance department to give you a call back and give you an estimate.  "That would be great."  (Austin Bunionectomy, Capsulotomy, and a Hammer Toe Repair - 1.5 hrs.)

## 2019-06-28 ENCOUNTER — Other Ambulatory Visit: Payer: Self-pay | Admitting: Podiatry

## 2019-06-28 ENCOUNTER — Encounter: Payer: Self-pay | Admitting: Podiatry

## 2019-06-28 DIAGNOSIS — M7752 Other enthesopathy of left foot: Secondary | ICD-10-CM | POA: Diagnosis not present

## 2019-06-28 DIAGNOSIS — M2042 Other hammer toe(s) (acquired), left foot: Secondary | ICD-10-CM

## 2019-06-28 DIAGNOSIS — M21542 Acquired clubfoot, left foot: Secondary | ICD-10-CM

## 2019-06-28 MED ORDER — OXYCODONE-ACETAMINOPHEN 5-325 MG PO TABS: 1.0000 | ORAL_TABLET | Freq: Four times a day (QID) | ORAL | 0 refills | Status: DC | PRN

## 2019-06-28 MED ORDER — CEPHALEXIN 500 MG PO CAPS: 500.0000 mg | ORAL_CAPSULE | Freq: Three times a day (TID) | ORAL | 0 refills | Status: DC

## 2019-06-28 MED ORDER — PROMETHAZINE HCL 25 MG PO TABS: 25.0000 mg | ORAL_TABLET | Freq: Three times a day (TID) | ORAL | 0 refills | Status: DC | PRN

## 2019-06-28 NOTE — Progress Notes (Signed)
Post op medications sent to pharmacy 

## 2019-06-30 ENCOUNTER — Telehealth: Payer: Self-pay | Admitting: *Deleted

## 2019-06-30 NOTE — Telephone Encounter (Signed)
Called and left a message for the patient to call me back-I was calling to see if patient was doing ok after having surgery on Wednesday June 28, 2019 with Dr Jacqualyn Posey and to call the [place] office at [phone]. Lattie Haw

## 2019-07-03 ENCOUNTER — Ambulatory Visit (INDEPENDENT_AMBULATORY_CARE_PROVIDER_SITE_OTHER): Payer: Medicare Other

## 2019-07-03 ENCOUNTER — Ambulatory Visit (INDEPENDENT_AMBULATORY_CARE_PROVIDER_SITE_OTHER): Payer: Medicare Other | Admitting: Podiatry

## 2019-07-03 ENCOUNTER — Other Ambulatory Visit: Payer: Self-pay

## 2019-07-03 DIAGNOSIS — M2042 Other hammer toe(s) (acquired), left foot: Secondary | ICD-10-CM

## 2019-07-03 DIAGNOSIS — M21612 Bunion of left foot: Secondary | ICD-10-CM

## 2019-07-03 MED ORDER — CEPHALEXIN 500 MG PO CAPS: 500.0000 mg | ORAL_CAPSULE | Freq: Three times a day (TID) | ORAL | 0 refills | Status: DC

## 2019-07-04 NOTE — Progress Notes (Signed)
Subjective: Holly Ramsey is a 68 y.o. is seen today in office s/p left Austin bunionectomy with second metatarsal osteotomy and second digit hammertoe repair preformed on 06/28/2019.  Overall she states that she feels great she is not taking any significant pain medication.  Her pain is controlled.  She says it does not feel as bad as what she thought it was coming.  Denies any systemic complaints such as fevers, chills, nausea, vomiting. No calf pain, chest pain, shortness of breath.   Objective: General: No acute distress, AAOx3  DP/PT pulses palpable 2/4, CRT < 3 sec to all digits.  Protective sensation intact. Motor function intact.  LEFT foot: Incision is well coapted without any evidence of dehiscence with sutures intact.  There is mild surrounding erythema on the incision there is no ascending cellulitis.  Upon removal of the bandage after sitting for some time the erythema was improved.  I think this is more pain from inflammation as well as the bandage.  There is no drainage or pus coming from the incision.  There is no fluctuation crepitation.  She is afebrile.  There is no malodor.  There is minimal tenderness to palpation at surgical site.  K wire intact the second toe without any drainage or pus.  Toes are in rectus position. No other open lesions or pre-ulcerative lesions.  No pain with calf compression, swelling, warmth, erythema.   Assessment and Plan:  Status post left foot surgery, doing well with no complications   -Treatment options discussed including all alternatives, risks, and complications -X-rays obtained reviewed.  Hardware intact.  The K wire in the second toe has come out some but still across the PIPJ. -Antibiotic ointment and a bandage applied to the incisions.  Keep dressing clean, dry, intact -Remain in cam boot continue elevation and ice. -Finish course of antibiotics and I did refill for an additional week given the mild erythema. -Monitor for any clinical  signs or symptoms of infection and DVT/PE and directed to call the office immediately should any occur or go to the ER. -Follow-up as scheduled or sooner if any problems arise. In the meantime, encouraged to call the office with any questions, concerns, change in symptoms.   Celesta Gentile, DPM

## 2019-07-06 ENCOUNTER — Telehealth: Payer: Self-pay | Admitting: Podiatry

## 2019-07-06 ENCOUNTER — Other Ambulatory Visit: Payer: Self-pay | Admitting: Podiatry

## 2019-07-06 MED ORDER — OXYCODONE-ACETAMINOPHEN 5-325 MG PO TABS: 1.0000 | ORAL_TABLET | Freq: Four times a day (QID) | ORAL | 0 refills | Status: DC | PRN

## 2019-07-06 NOTE — Telephone Encounter (Signed)
done

## 2019-07-06 NOTE — Telephone Encounter (Signed)
I called Holly Ramsey and she states her pain on occasion would be a +6, stinging and burning with occasion sharp pain, and is taking one pain pill midday and at bedtime. I told Holly Ramsey that I would inform Dr. Jacqualyn Posey and she should contact her pharmacy to see if the pain medication is ready.

## 2019-07-06 NOTE — Telephone Encounter (Signed)
Patient wants to know if she can get a refill of pain medication from surgery 06/28/19 to  [facility]

## 2019-07-13 ENCOUNTER — Ambulatory Visit (INDEPENDENT_AMBULATORY_CARE_PROVIDER_SITE_OTHER): Payer: Medicare Other

## 2019-07-13 ENCOUNTER — Other Ambulatory Visit: Payer: Self-pay

## 2019-07-13 ENCOUNTER — Ambulatory Visit (INDEPENDENT_AMBULATORY_CARE_PROVIDER_SITE_OTHER): Payer: Self-pay | Admitting: Podiatry

## 2019-07-13 DIAGNOSIS — M21612 Bunion of left foot: Secondary | ICD-10-CM | POA: Diagnosis not present

## 2019-07-13 DIAGNOSIS — M2042 Other hammer toe(s) (acquired), left foot: Secondary | ICD-10-CM

## 2019-07-13 MED ORDER — OXYCODONE-ACETAMINOPHEN 5-325 MG PO TABS: 1.0000 | ORAL_TABLET | Freq: Four times a day (QID) | ORAL | 0 refills | Status: DC | PRN

## 2019-07-14 NOTE — Progress Notes (Signed)
Subjective: Holly Ramsey is a 68 y.o. is seen today in office s/p left Austin bunionectomy with second metatarsal osteotomy and second digit hammertoe repair preformed on 06/28/2019.  Overall she states that she is doing well.  She is taking pain medicine only at nighttime she is requesting a refill.  She has stopped anti-inflammatories due to stomach upset. Denies any systemic complaints such as fevers, chills, nausea, vomiting. No calf pain, chest pain, shortness of breath.   Objective: General: No acute distress, AAOx3  DP/PT pulses palpable 2/4, CRT < 3 sec to all digits.  Protective sensation intact. Motor function intact.  LEFT foot: Incision is well coapted without any evidence of dehiscence with sutures intact.  There is faint erythema but much improved compared to last appointment.  There is no drainage or pus from the incision.  There is no ascending cellulitis.  No malodor.  K wire intact the second toe that appears to be almost out.  The toes are in rectus position.  There is no drainage or pus coming from the K wire site. No other open lesions or pre-ulcerative lesions.  No pain with calf compression, swelling, warmth, erythema.   Assessment and Plan:  Status post left foot surgery, doing well with no complications   -Treatment options discussed including all alternatives, risks, and complications -X-rays obtained reviewed.  Hardware intact.  The K wire in the second toe has come out and is no longer across the PIPJ. -K wire removed today.  Discussed risks of this. -Antibiotic ointment and dressing applied to the incision.  Keep the dressing clean, dry, intact. -Finish course of antibiotics. -Remain in cam boot at all times.  Ice elevation.  Return in about 1 week (around 07/20/2019) for suture removal .  Trula Slade DPM

## 2019-07-24 ENCOUNTER — Other Ambulatory Visit: Payer: Self-pay

## 2019-07-24 ENCOUNTER — Ambulatory Visit (INDEPENDENT_AMBULATORY_CARE_PROVIDER_SITE_OTHER): Payer: Medicare Other | Admitting: Podiatry

## 2019-07-24 DIAGNOSIS — M2042 Other hammer toe(s) (acquired), left foot: Secondary | ICD-10-CM

## 2019-07-24 DIAGNOSIS — M21612 Bunion of left foot: Secondary | ICD-10-CM

## 2019-07-31 DIAGNOSIS — M21612 Bunion of left foot: Secondary | ICD-10-CM | POA: Insufficient documentation

## 2019-07-31 NOTE — Progress Notes (Signed)
Subjective: Holly Ramsey is a 68 y.o. is seen today in office s/p left Austin bunionectomy with second metatarsal osteotomy and second digit hammertoe repair preformed on 06/28/2019.  She states that she is doing well she is not having any significant discomfort.  She presents today to remove sutures.  She denies any fevers, chills, nausea, vomiting.  Denies any calf pain, chest pain, shortness of breath.  She has no other concerns today.   Objective: General: No acute distress, AAOx3  DP/PT pulses palpable 2/4, CRT < 3 sec to all digits.  Protective sensation intact. Motor function intact.  LEFT foot: Incision is well coapted without any evidence of dehiscence with sutures intact.  The erythema that she has has resolved.  There is no discomfort to the surgical sites.  There is no drainage or pus or any signs of infection.  Mild swelling but no warmth.  Hallux abductus although mild is still present.  Second digit is in a rectus position. No other open lesions or pre-ulcerative lesions.  No pain with calf compression, swelling, warmth, erythema.   Assessment and Plan:  Status post left foot surgery, doing well with no complications   -Treatment options discussed including all alternatives, risks, and complications -Remove the sutures today without any complications.  Steri-Strips were applied for reinforcement followed by antibiotic ointment and a dressing.  Keep the dressing clean, dry, intact.  Splint to help hold the hallux in a rectus position.  -Antibiotic ointment and dressing applied to the incision.  Keep the dressing clean, dry, intact. -Remain in cam boot at all times.  Ice elevation.  Return in about 2 weeks (around 08/07/2019). x-ray next appointment   Trula Slade DPM

## 2019-08-07 ENCOUNTER — Ambulatory Visit (INDEPENDENT_AMBULATORY_CARE_PROVIDER_SITE_OTHER): Payer: Medicare Other

## 2019-08-07 ENCOUNTER — Other Ambulatory Visit: Payer: Self-pay

## 2019-08-07 ENCOUNTER — Ambulatory Visit (INDEPENDENT_AMBULATORY_CARE_PROVIDER_SITE_OTHER): Payer: Medicare Other | Admitting: Podiatry

## 2019-08-07 DIAGNOSIS — M2042 Other hammer toe(s) (acquired), left foot: Secondary | ICD-10-CM

## 2019-08-07 DIAGNOSIS — M21612 Bunion of left foot: Secondary | ICD-10-CM

## 2019-08-07 DIAGNOSIS — Z9889 Other specified postprocedural states: Secondary | ICD-10-CM

## 2019-08-07 MED ORDER — OXYCODONE-ACETAMINOPHEN 5-325 MG PO TABS: 1.0000 | ORAL_TABLET | Freq: Three times a day (TID) | ORAL | 0 refills | Status: DC | PRN

## 2019-08-13 NOTE — Progress Notes (Signed)
Subjective: Holly Ramsey is a 68 y.o. is seen today in office s/p left Austin bunionectomy with second metatarsal osteotomy and second digit hammertoe repair preformed on 06/28/2019.  She states that she is doing well and not having significant pain.  She states on the second toe she describes a "prickley sensation but not severe".  She states that the bunion splint has been uncomfortable so she uses something that she sews with which is more comfortable to keep the toes separated.  She is still in the offloading boot. She denies any fevers, chills, nausea, vomiting.  Denies any calf pain, chest pain, shortness of breath.  She has no other concerns today.   Objective: General: No acute distress, AAOx3  DP/PT pulses palpable 2/4, CRT < 3 sec to all digits.  Protective sensation intact. Motor function intact.  LEFT foot: Incision is well coapted without any evidence of dehiscence and appears to be healing well.  There is no surrounding erythema, ascending cellulitis.  There is no fluctuation or crepitation.  There is no malodor.  Hallux abductus still present.  No significant pain in the surgical site.  No other open lesions or pre-ulcerative lesions.  No pain with calf compression, swelling, warmth, erythema.   Assessment and Plan:  Status post left foot surgery, doing well with no complications   -Treatment options discussed including all alternatives, risks, and complications -X-rays obtained and reviewed.  Hardware intact.  Osteotomy still evident the first metatarsal although there is some evidence of consolidation. -For now remain in the offloading boot.  Continue to splint the hallux in a rectus position. -Ice, elevate -Monitor for any clinical signs or symptoms of infection and directed to call the office immediately should any occur or go to the ER.  Return in about 2 weeks (around 08/21/2019). x-ray next appointment   Trula Slade DPM

## 2019-08-21 ENCOUNTER — Ambulatory Visit (INDEPENDENT_AMBULATORY_CARE_PROVIDER_SITE_OTHER): Payer: Medicare Other | Admitting: Podiatry

## 2019-08-21 ENCOUNTER — Ambulatory Visit (INDEPENDENT_AMBULATORY_CARE_PROVIDER_SITE_OTHER): Payer: Medicare Other

## 2019-08-21 ENCOUNTER — Other Ambulatory Visit: Payer: Self-pay

## 2019-08-21 DIAGNOSIS — M21612 Bunion of left foot: Secondary | ICD-10-CM | POA: Diagnosis not present

## 2019-08-21 DIAGNOSIS — Z9889 Other specified postprocedural states: Secondary | ICD-10-CM

## 2019-08-27 NOTE — Progress Notes (Signed)
Subjective: Holly Ramsey is a 68 y.o. is seen today in office s/p left Austin bunionectomy with second metatarsal osteotomy and second digit hammertoe repair preformed on 06/28/2019.  She states that she is doing better and she started to transition her self.  She has gone into a clog style shoe which is been comfortable for her.  She is also been keeping the first and second toes separated which is been helpful.  She does have occasional discomfort in the second toe is not consistent.  Overall she does feel that she is making improvement.  Denies any recent injury.  No other concerns today. She denies any fevers, chills, nausea, vomiting.  Denies any calf pain, chest pain, shortness of breath.  She has no other concerns today.   Objective: General: No acute distress, AAOx3  DP/PT pulses palpable 2/4, CRT < 3 sec to all digits.  Protective sensation intact. Motor function intact.  LEFT foot: Incision is well coapted without any evidence of dehiscence and appears to be healing well.  Overall the toes are sitting in the more rectus position and improved compared to prior to surgery.  There is no pain to the first MPJ range of motion.  Minimal discomfort at the second toe and surgical site but no areas of discomfort identified. No pain with calf compression, swelling, warmth, erythema.   Assessment and Plan:  Status post left foot surgery, doing well with no complications   -Treatment options discussed including all alternatives, risks, and complications -X-rays obtained and reviewed.  Hardware intact.  Osteotomy still evident the first metatarsal on the dorsal aspect although there is some evidence of consolidation.  Previous second digit hammertoe repair -She is back to regular shoe.  Discussed gradual increase activity level.  Prescription for physical therapy was written today for [facility].  Continue range of motion exercises at home as well.  Continue to ice elevate. -Monitor  for any clinical signs or symptoms of infection and directed to call the office immediately should any occur or go to the ER.  Return in about 4 weeks (around 09/18/2019).  Trula Slade DPM

## 2019-09-18 ENCOUNTER — Encounter: Payer: Self-pay | Admitting: Podiatry

## 2019-09-18 ENCOUNTER — Ambulatory Visit (INDEPENDENT_AMBULATORY_CARE_PROVIDER_SITE_OTHER): Payer: Medicare PPO | Admitting: Podiatry

## 2019-09-18 ENCOUNTER — Other Ambulatory Visit: Payer: Self-pay

## 2019-09-18 ENCOUNTER — Ambulatory Visit (INDEPENDENT_AMBULATORY_CARE_PROVIDER_SITE_OTHER): Payer: Medicare PPO

## 2019-09-18 DIAGNOSIS — M2042 Other hammer toe(s) (acquired), left foot: Secondary | ICD-10-CM

## 2019-09-18 DIAGNOSIS — M21612 Bunion of left foot: Secondary | ICD-10-CM

## 2019-09-18 DIAGNOSIS — Z9889 Other specified postprocedural states: Secondary | ICD-10-CM

## 2019-09-19 DIAGNOSIS — M2042 Other hammer toe(s) (acquired), left foot: Secondary | ICD-10-CM | POA: Insufficient documentation

## 2019-09-19 NOTE — Progress Notes (Signed)
Subjective: Holly Ramsey is a 69 y.o. is seen today in office s/p left Austin bunionectomy with second metatarsal osteotomy and second digit hammertoe repair preformed on 06/28/2019.  She states that she is doing well.  No pain on the bunion site.  She was having some discomfort of the second toe but that seems to have resolved the last couple of days.  She is wearing a regular shoe.  She still keep toe separator in between her first and second toes.  She did not start physical therapy and she is asking about exercises that she can do at home.  She does not feel comfortable going to therapy right now due to the pandemic.    Denies any recent injury.  No other concerns today. She denies any fevers, chills, nausea, vomiting.  Denies any calf pain, chest pain, shortness of breath.  She has no other concerns today.   Objective: General: No acute distress, AAOx3  DP/PT pulses palpable 2/4, CRT < 3 sec to all digits.  Protective sensation intact. Motor function intact.  LEFT foot: Incision is well coapted without any evidence of dehiscence and scars are well formed.  The toes are in rectus position There is no discomfort of the surgical sites in either the bunion or the second toe.  Mild swelling to the second toe but there is no erythema or warmth.  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 No pain with calf compression, swelling, warmth, erythema.   Assessment and Plan:  Status post left foot surgery, doing well with no complications   -Treatment options discussed including all alternatives, risks, and complications -X-rays obtained and reviewed.  Hardware intact.  Osteotomy still evident the first metatarsal on the dorsal aspect although there is increased consolidation.  Second toe with mild transverse deformity. -Continue with a toe separator.  Under continue supportive shoes.  Discussed exercises that she can start to perform on her own at home.  She was to hold off on formal  physical therapy and as long as we do this at home we will continue with this.  Continue to ice and elevate.  Return in about 6 weeks (around 10/30/2019).  Discussed if she is doing well I can follow-up with her as needed as well if she would like.  Vivi Barrack DPM

## 2019-09-25 ENCOUNTER — Other Ambulatory Visit: Payer: Self-pay | Admitting: Podiatry

## 2019-10-27 ENCOUNTER — Other Ambulatory Visit: Payer: Medicare PPO | Admitting: Orthotics

## 2019-10-30 ENCOUNTER — Encounter: Payer: Medicare PPO | Admitting: Podiatry

## 2019-11-10 ENCOUNTER — Other Ambulatory Visit: Payer: Self-pay

## 2019-11-10 ENCOUNTER — Other Ambulatory Visit: Payer: Medicare PPO | Admitting: Orthotics

## 2019-11-10 DIAGNOSIS — M21612 Bunion of left foot: Secondary | ICD-10-CM | POA: Diagnosis not present

## 2019-11-10 DIAGNOSIS — M779 Enthesopathy, unspecified: Secondary | ICD-10-CM | POA: Diagnosis not present

## 2019-12-05 ENCOUNTER — Ambulatory Visit: Payer: Medicare PPO | Admitting: Orthotics

## 2019-12-05 ENCOUNTER — Other Ambulatory Visit: Payer: Self-pay

## 2019-12-05 DIAGNOSIS — Z9889 Other specified postprocedural states: Secondary | ICD-10-CM

## 2019-12-05 DIAGNOSIS — M21612 Bunion of left foot: Secondary | ICD-10-CM

## 2019-12-05 DIAGNOSIS — M2042 Other hammer toe(s) (acquired), left foot: Secondary | ICD-10-CM

## 2019-12-05 NOTE — Progress Notes (Signed)
Patient came in today to pick up custom made foot orthotics.  The goals were accomplished and the patient reported no dissatisfaction with said orthotics.  Patient was advised of breakin period and how to report any issues. 

## 2020-04-25 ENCOUNTER — Ambulatory Visit: Payer: Medicare PPO | Admitting: Podiatry

## 2020-12-05 LAB — HM MAMMOGRAPHY

## 2020-12-17 ENCOUNTER — Encounter: Payer: Self-pay | Admitting: Family Medicine

## 2021-03-06 ENCOUNTER — Other Ambulatory Visit: Payer: Self-pay | Admitting: Gastroenterology

## 2021-03-06 ENCOUNTER — Other Ambulatory Visit (HOSPITAL_COMMUNITY): Payer: Self-pay | Admitting: Gastroenterology

## 2021-03-06 DIAGNOSIS — R1310 Dysphagia, unspecified: Secondary | ICD-10-CM

## 2021-03-11 ENCOUNTER — Other Ambulatory Visit: Payer: Self-pay

## 2021-03-11 ENCOUNTER — Ambulatory Visit (HOSPITAL_COMMUNITY)
Admission: RE | Admit: 2021-03-11 | Discharge: 2021-03-11 | Disposition: A | Discharge status: Home / Self Care | Payer: Medicare PPO | Source: Ambulatory Visit | Attending: Gastroenterology | Admitting: Gastroenterology

## 2021-03-11 DIAGNOSIS — R1310 Dysphagia, unspecified: Secondary | ICD-10-CM | POA: Diagnosis not present

## 2022-05-06 ENCOUNTER — Encounter: Payer: Self-pay | Admitting: Internal Medicine

## 2022-06-09 ENCOUNTER — Encounter: Payer: Self-pay | Admitting: Internal Medicine

## 2022-06-22 ENCOUNTER — Encounter: Payer: Self-pay | Admitting: Internal Medicine

## 2023-06-23 IMAGING — RF DG ESOPHAGUS
9 series · 13 of 24 positions shown · non-contrast
Comparison: None.

CLINICAL DATA: Dysphagia. patient feels like throat sometimes
"closes up" if eating too quickly

EXAM:
ESOPHOGRAM / BARIUM SWALLOW / BARIUM TABLET STUDY
TECHNIQUE: Combined double contrast and single contrast examination performed
using effervescent crystals, thick barium liquid, and thin barium
liquid. The patient was observed with fluoroscopy swallowing a 13 mm
barium sulphate tablet.
FLUOROSCOPY TIME:  Fluoroscopy Time:  1 minutes and 36 seconds.
Radiation Exposure Index (if provided by the fluoroscopic device):
21.6 mGy
Number of Acquired Spot Images:

[Series 1: fluoro_barium 2fps_bw · 0.18mm/px · 2 of 12 frames shown (1 of 3)]
[frame 2/12]
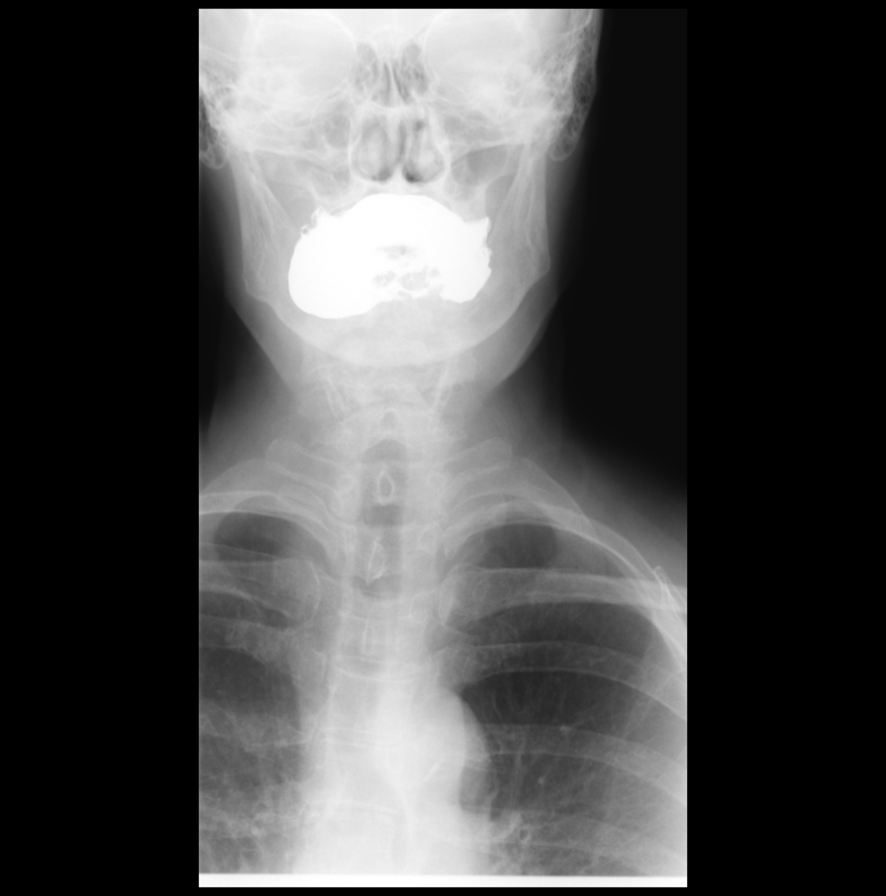
[frame 11/12]
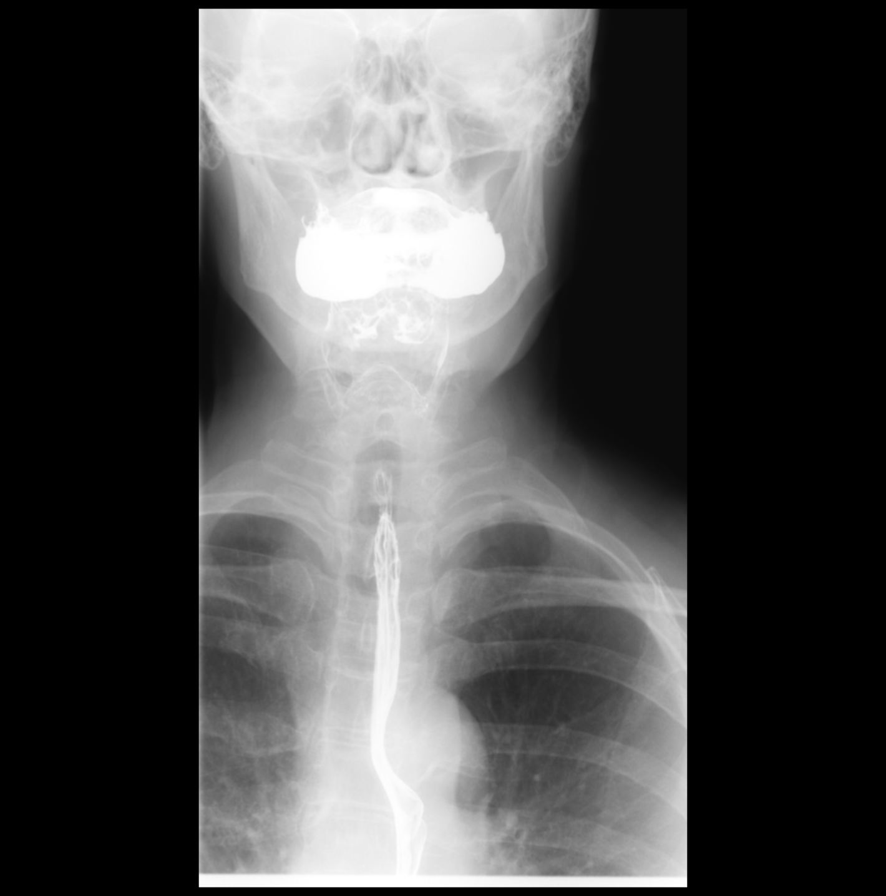

[Series 2: fluoro_barium 2fps_bw · 0.17mm/px · 1 of 22 frames shown (2 of 3)]
[frame 19/22]
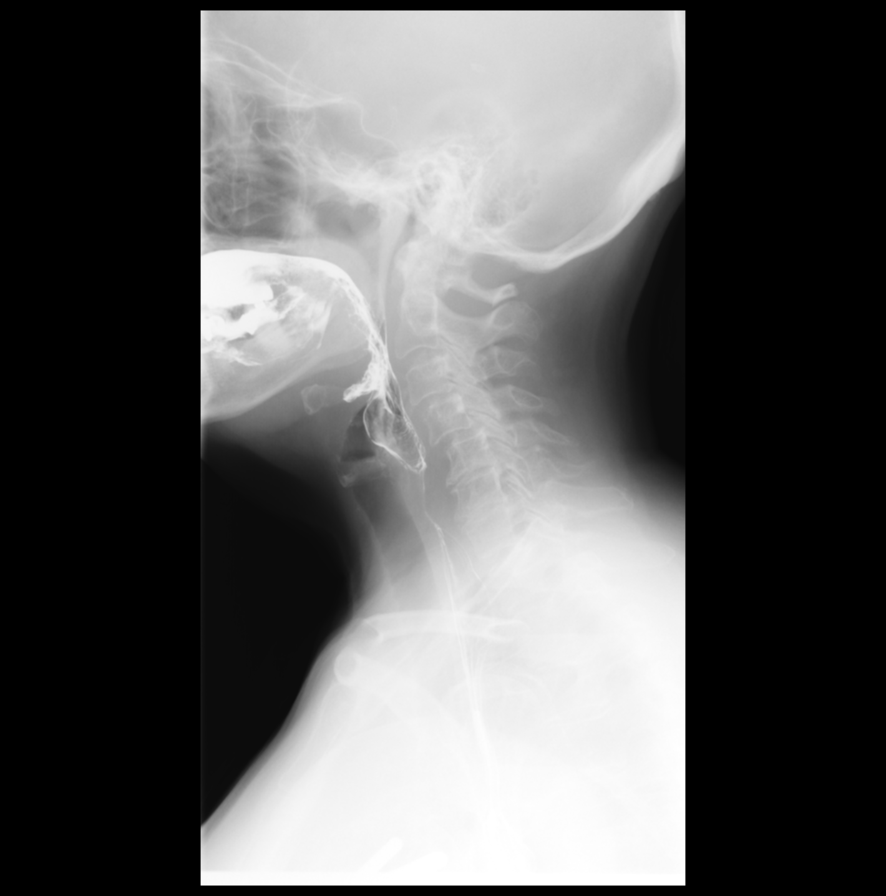

[Series 3: fluoro_barium 2fps_bw · 0.18mm/px · 1 of 23 frames shown (3 of 3)]
[frame 12/23]
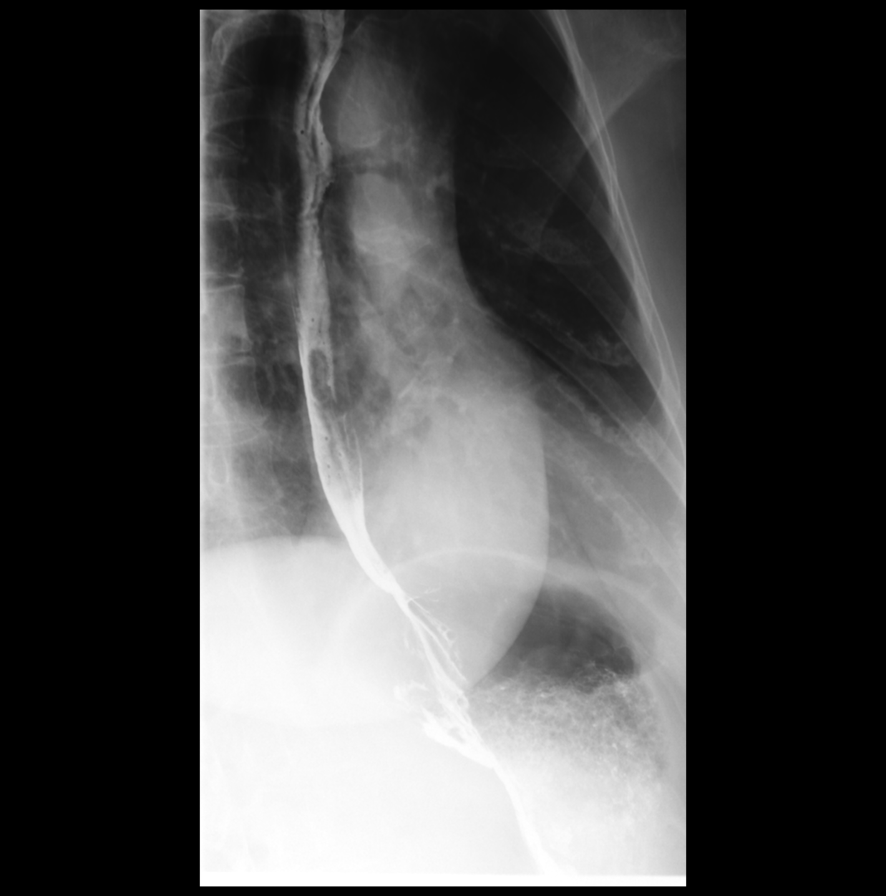

[Series 4: cp_standard · 0.53mm/px · 2 of 44 frames shown (1 of 6)]
[frame 1/44]
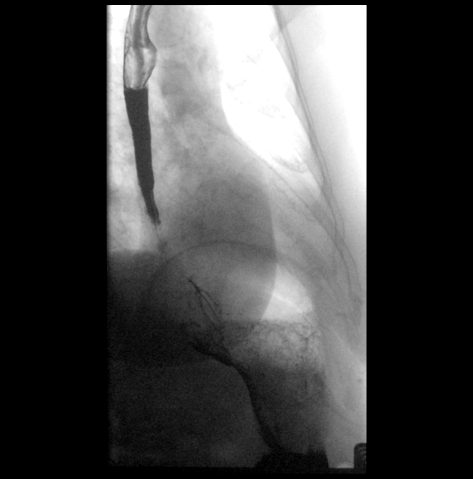
[frame 38/44]
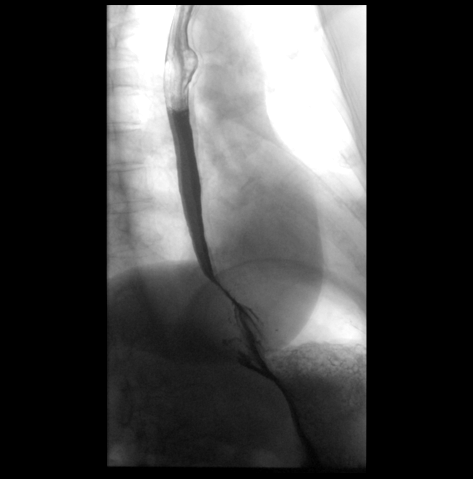

[Series 5: cp_standard · 0.56mm/px · 1 of 24 frames shown (2 of 6)]
[frame 21/24]
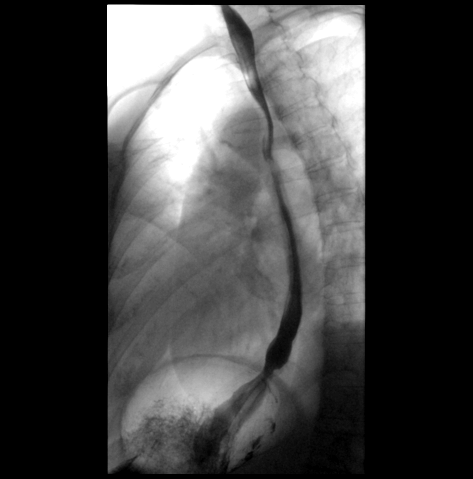

[Series 6: cp_standard · 0.56mm/px · 2 of 22 frames shown (3 of 6)]
[frame 4/22]
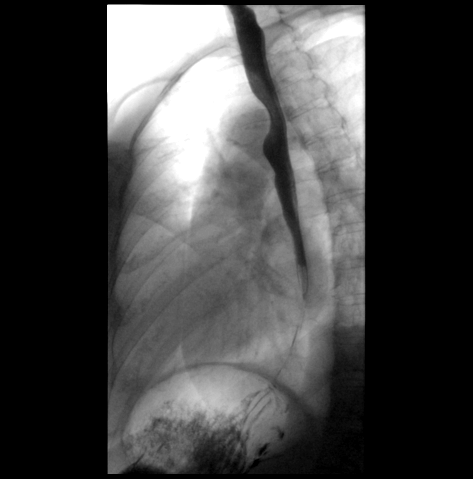
[frame 19/22]
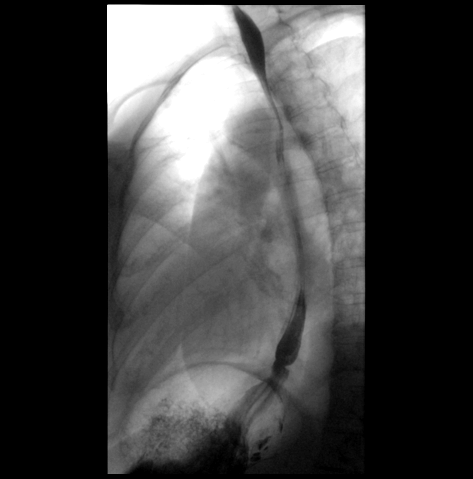

[Series 7: cp_standard · 0.56mm/px · 1 of 28 frames shown (4 of 6)]
[frame 15/28]
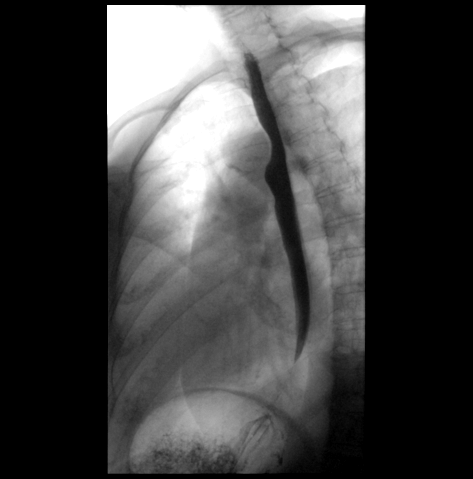

[Series 8: cp_standard · 0.56mm/px · 1 of 29 frames shown (5 of 6)]
[frame 15/29]
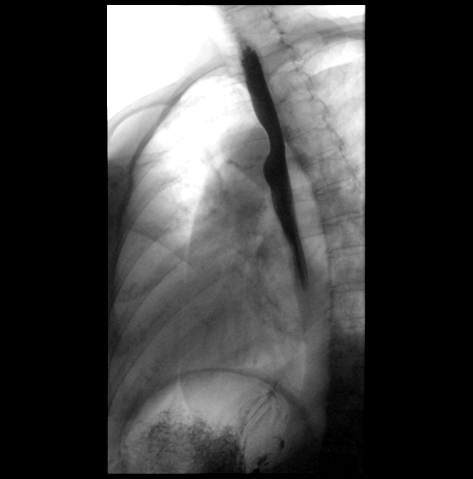

[Series 9: cp_standard · 0.57mm/px · 2 of 47 frames shown (6 of 6)]
[frame 8/47]
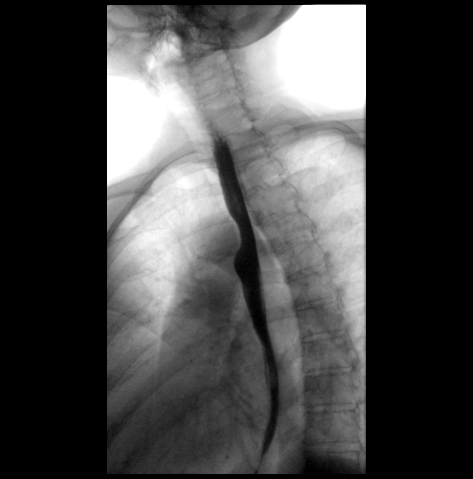
[frame 40/47]
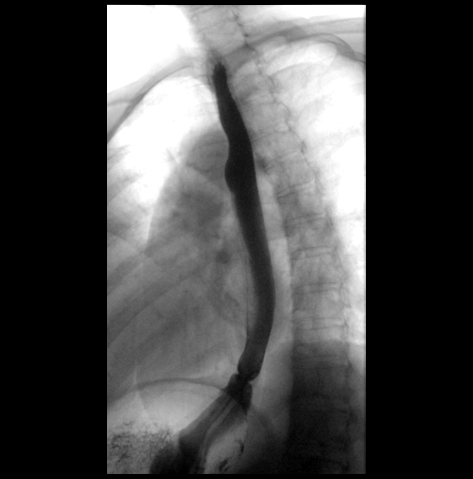

[13 of 24 positions shown; findings below may reference images not displayed]

FINDINGS: Frontal and lateral views of the hypopharynx while swallowing thick
barium are unremarkable.

Double contrast imaging of the esophagus shows no evidence for mass
lesion, diverticulum, or gross mucosal ulceration.

Evaluation of esophageal motility reveals preservation of primary
peristalsis with some proximal escape at the level of the mid
esophagus. No tertiary contractions or presbyesophagus.

Tiny sliding type hiatal hernia is noted with prominent Schatzki
ring. Unfortunately, the patient was unable to swallow the barium
tablet (and reports inability to swallow pills whole at home), but
the narrowing of the Schatzki ring would likely obstructs passage of
the barium tablet.
IMPRESSION: 1. Tiny sliding type hiatal hernia, with prominent Schatzki ring.
Patient was unable to swallow a barium tablet but qualitative
appearance is suspicious for symptomatic narrowing at the level of
the ring.
2. Otherwise unremarkable exam.

## 2023-10-26 ENCOUNTER — Encounter: Payer: Self-pay | Admitting: Internal Medicine

## 2024-01-10 LAB — HM MAMMOGRAPHY
